# Patient Record
Sex: Female | Born: 1990 | Race: White | Hispanic: No | Marital: Married | Smoking: Never smoker
Health system: Southern US, Community
[De-identification: ages and names within clinical notes are randomized; demographics above are authoritative.]

---
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
## 2010-10-26 IMAGING — US US ABDOMEN COMPLETE
1 series · 13 of 25 positions shown · non-contrast
Comparison: [HOSPITAL] ultrasound 12/13/2008

CLINICAL DATA: Follow-up liver lesion.

COMPLETE ABDOMINAL ULTRASOUND

[Series 1: us abd limited · 0.32mm/px · 13 of 34 slices shown]
[im 1/34]
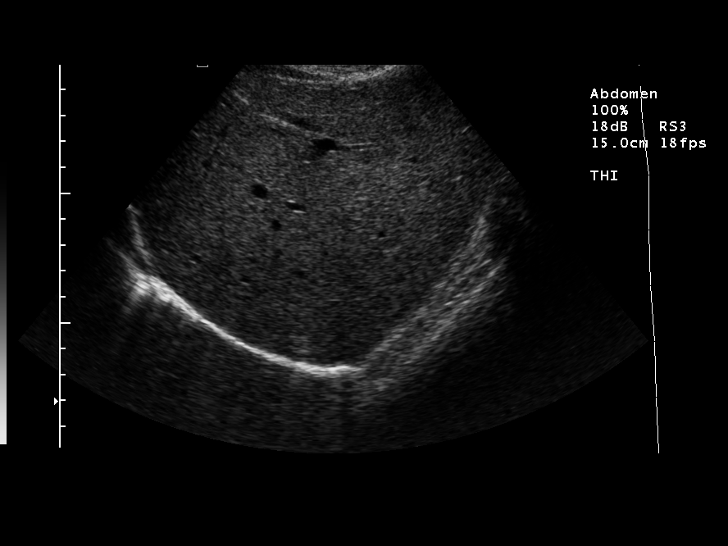
[im 3/34]
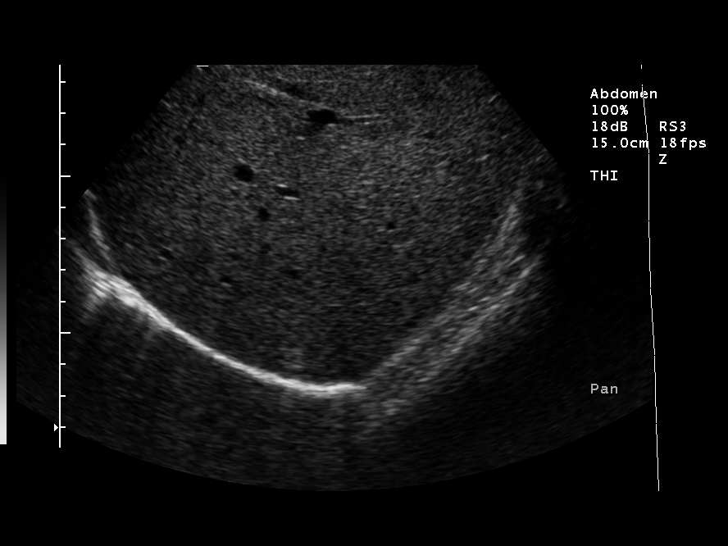
[im 6/34]
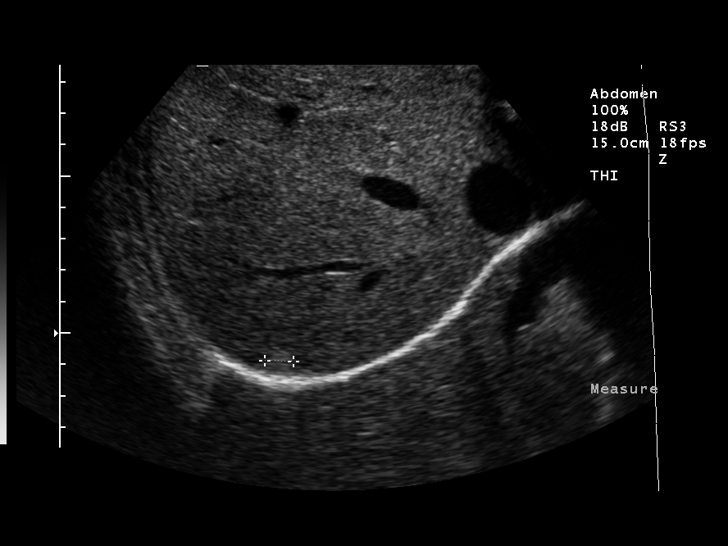
[im 9/34]
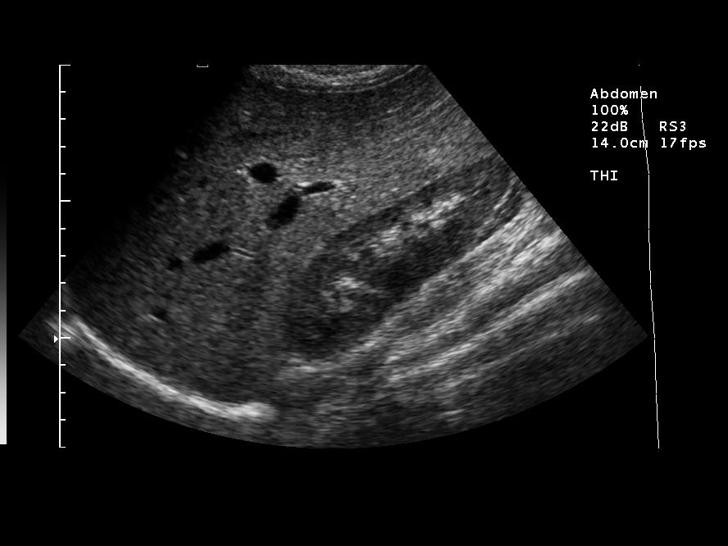
[im 12/34]
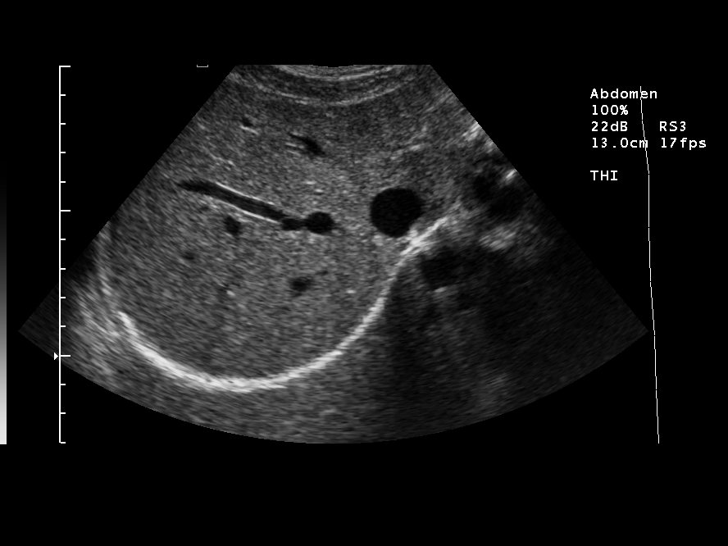
[im 14/34]
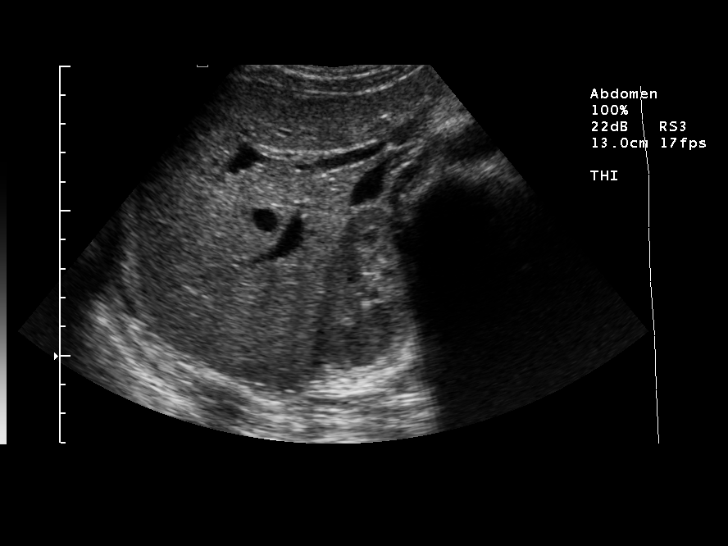
[im 17/34]
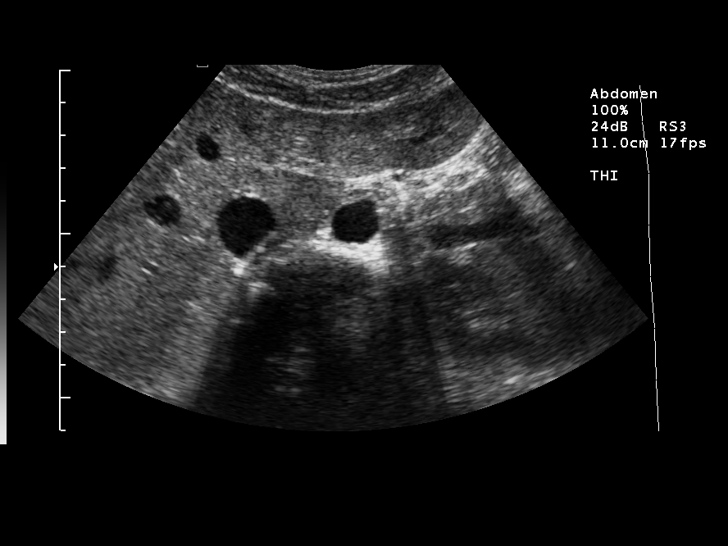
[im 20/34]
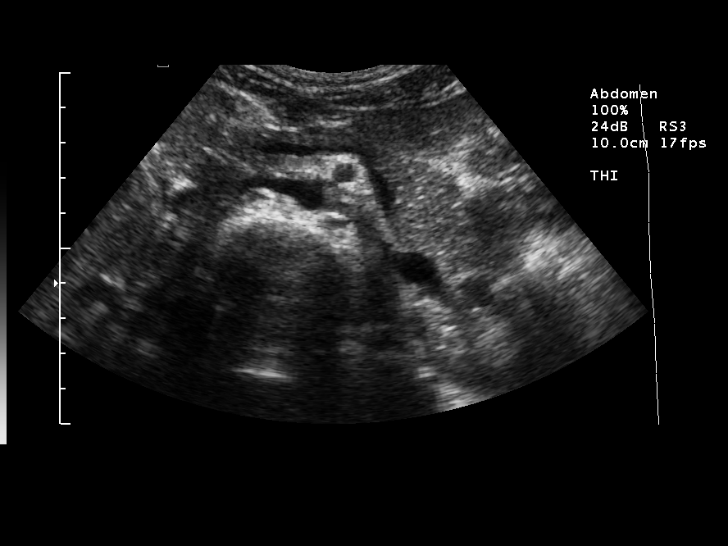
[im 23/34]
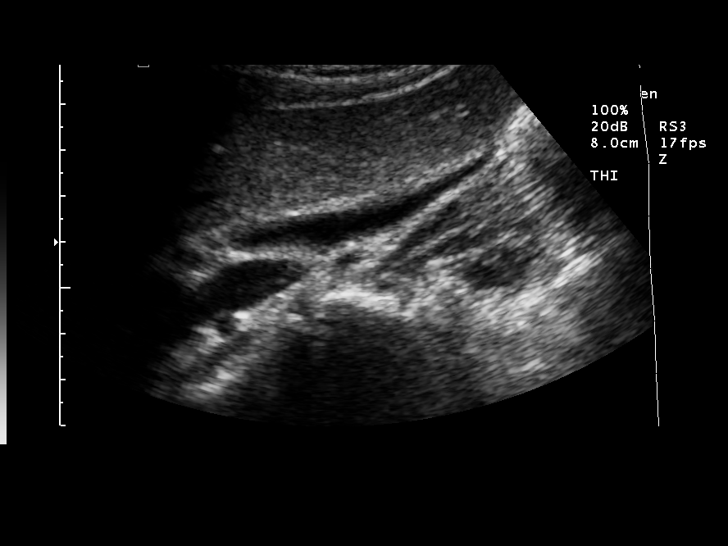
[im 25/34]
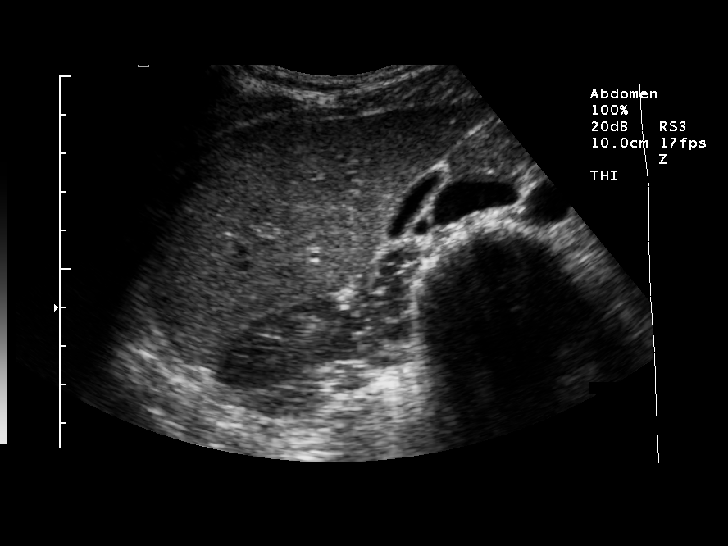
[im 28/34]
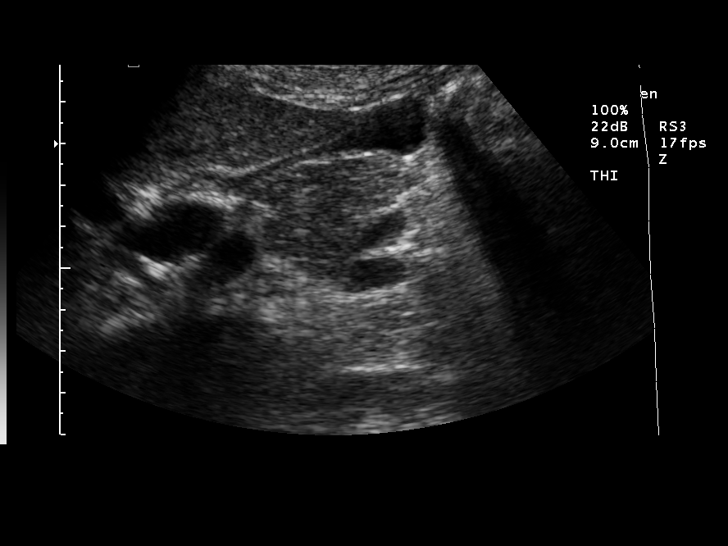
[im 31/34]
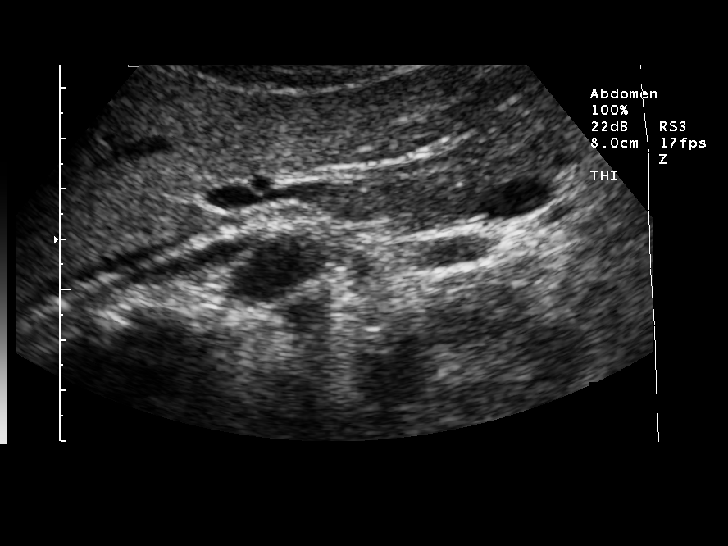
[im 34/34]
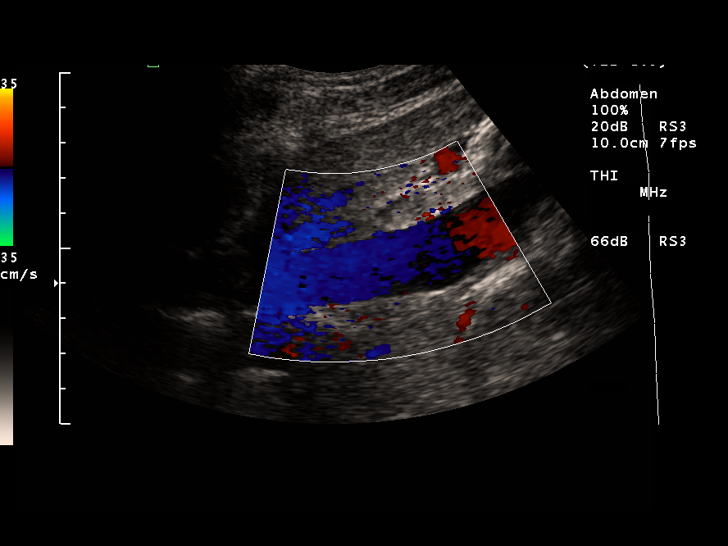

[13 of 25 positions shown; findings below may reference images not displayed]

FINDINGS: Gallbladder:  No stones or wall thickening.  Negative sonographic
Odd-Gunnar.

Common bile duct:   Within normal limits in caliber.

Liver:  Again noted is the posterior right hepatic lobe hyperechoic
lesion.  This currently measures 11 x 11 x 8 mm, compared to 9 x 9
x 8 mm previously.  Despite the fact that this measures slightly
larger on today's study, visually, this appears stable.  I favor
that the slight apparent increase in size is more likely related to
differences in scan planes and measuring technique.  No new liver
lesion is seen.

IVC:  Appears normal.

Pancreas:  No focal abnormality seen.

Spleen:  Within normal limits in size and echotexture.

Right Kidney:   Normal in size and parenchymal echogenicity.  No
evidence of mass or hydronephrosis.

Left Kidney:  Normal in size and parenchymal echogenicity.  No
evidence of mass or hydronephrosis.

Abdominal aorta:  No aneurysm identified.
IMPRESSION: 11 mm hyperechoic posterior right hepatic lesion remains most
consistent with hemangioma.  Visually, this appears stable and I
doubt significant change.

## 2011-05-20 LAB — PREGNANCY, URINE: Preg Test, Ur: NEGATIVE

## 2013-12-19 ENCOUNTER — Encounter (HOSPITAL_COMMUNITY): Payer: Self-pay | Admitting: Emergency Medicine

## 2013-12-19 ENCOUNTER — Emergency Department (HOSPITAL_COMMUNITY)
Admission: EM | Admit: 2013-12-19 | Discharge: 2013-12-19 | Disposition: A | Discharge status: Home / Self Care | Payer: 59 | Attending: Emergency Medicine | Admitting: Emergency Medicine

## 2013-12-19 ENCOUNTER — Emergency Department (HOSPITAL_COMMUNITY): Payer: 59

## 2013-12-19 DIAGNOSIS — Y92410 Unspecified street and highway as the place of occurrence of the external cause: Secondary | ICD-10-CM | POA: Insufficient documentation

## 2013-12-19 DIAGNOSIS — Y9389 Activity, other specified: Secondary | ICD-10-CM | POA: Insufficient documentation

## 2013-12-19 DIAGNOSIS — S43499A Other sprain of unspecified shoulder joint, initial encounter: Secondary | ICD-10-CM | POA: Insufficient documentation

## 2013-12-19 DIAGNOSIS — R209 Unspecified disturbances of skin sensation: Secondary | ICD-10-CM | POA: Insufficient documentation

## 2013-12-19 DIAGNOSIS — S46912A Strain of unspecified muscle, fascia and tendon at shoulder and upper arm level, left arm, initial encounter: Secondary | ICD-10-CM

## 2013-12-19 DIAGNOSIS — S199XXA Unspecified injury of neck, initial encounter: Secondary | ICD-10-CM

## 2013-12-19 DIAGNOSIS — S46819A Strain of other muscles, fascia and tendons at shoulder and upper arm level, unspecified arm, initial encounter: Secondary | ICD-10-CM

## 2013-12-19 DIAGNOSIS — S0993XA Unspecified injury of face, initial encounter: Secondary | ICD-10-CM | POA: Insufficient documentation

## 2013-12-19 MED ORDER — NAPROXEN 500 MG PO TABS: 500.0000 mg | ORAL_TABLET | Freq: Two times a day (BID) | ORAL | Status: AC

## 2013-12-19 MED ORDER — METHOCARBAMOL 500 MG PO TABS: 500.0000 mg | ORAL_TABLET | Freq: Two times a day (BID) | ORAL | Status: AC
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
## 2013-12-19 NOTE — Discharge Instructions (Signed)
Recommend Mobic and Robaxin as prescribed. Apply ice to your shoulder 3-4 times per day. Refrain from strenuous activity or heavy lifting for 1 to 2 weeks. Take your shoulder out of the sling 3-4 times per day for stretching. Followup with an orthopedist if symptoms persist.  Muscle Strain A muscle strain is an injury that occurs when a muscle is stretched beyond its normal length. Usually a small number of muscle fibers are torn when this happens. Muscle strain is rated in degrees. First-degree strains have the least amount of muscle fiber tearing and pain. Second-degree and third-degree strains have increasingly more tearing and pain.  Usually, recovery from muscle strain takes 1 2 weeks. Complete healing takes 5 6 weeks.  CAUSES  Muscle strain happens when a sudden, violent force placed on a muscle stretches it too far. This may occur with lifting, sports, or a fall.  RISK FACTORS Muscle strain is especially common in athletes.  SIGNS AND SYMPTOMS At the site of the muscle strain, there may be:  Pain.  Bruising.  Swelling.  Difficulty using the muscle due to pain or lack of normal function. DIAGNOSIS  Your health care provider will perform a physical exam and ask about your medical history. TREATMENT  Often, the best treatment for a muscle strain is resting, icing, and applying cold compresses to the injured area.  HOME CARE INSTRUCTIONS   Use the PRICE method of treatment to promote muscle healing during the first 2 3 days after your injury. The PRICE method involves:  Protecting the muscle from being injured again.  Restricting your activity and resting the injured body part.  Icing your injury. To do this, put ice in a plastic bag. Place a towel between your skin and the bag. Then, apply the ice and leave it on from 15 20 minutes each hour. After the third day, switch to moist heat packs.  Apply compression to the injured area with a splint or elastic bandage. Be careful not  to wrap it too tightly. This may interfere with blood circulation or increase swelling.  Elevate the injured body part above the level of your heart as often as you can.  Only take over-the-counter or prescription medicines for pain, discomfort, or fever as directed by your health care provider.  Warming up prior to exercise helps to prevent future muscle strains. SEEK MEDICAL CARE IF:   You have increasing pain or swelling in the injured area.  You have numbness, tingling, or a significant loss of strength in the injured area. MAKE SURE YOU:   Understand these instructions.  Will watch your condition.  Will get help right away if you are not doing well or get worse. Document Released: 08/16/2005 Document Revised: 06/06/2013 Document Reviewed: 03/15/2013 [facility] Patient Information 2014 [place], [place]. Shoulder Exercises EXERCISES  RANGE OF MOTION (ROM) AND STRETCHING EXERCISES These exercises may help you when beginning to rehabilitate your injury. Your symptoms may resolve with or without further involvement from your physician, physical therapist or athletic trainer. While completing these exercises, remember:   Restoring tissue flexibility helps normal motion to return to the joints. This allows healthier, less painful movement and activity.  An effective stretch should be held for at least 30 seconds.  A stretch should never be painful. You should only feel a gentle lengthening or release in the stretched tissue. ROM - Pendulum  Bend at the waist so that your right / left arm falls away from your body. Support yourself with your opposite hand  on a solid surface, such as a table or a countertop.  Your right / left arm should be perpendicular to the ground. If it is not perpendicular, you need to lean over farther. Relax the muscles in your right / left arm and shoulder as much as possible.  Gently sway your hips and trunk so they move your right / left arm without any use  of your right / left shoulder muscles.  Progress your movements so that your right / left arm moves side to side, then forward and backward, and finally, both clockwise and counterclockwise.  Complete __________ repetitions in each direction. Many people use this exercise to relieve discomfort in their shoulder as well as to gain range of motion. Repeat __________ times. Complete this exercise __________ times per day. STRETCH  Flexion, Standing  Stand with good posture. With an underhand grip on your right / left hand and an overhand grip on the opposite hand, grasp a broomstick or cane so that your hands are a little more than shoulder-width apart.  Keeping your right / left elbow straight and shoulder muscles relaxed, push the stick with your opposite hand to raise your right / left arm in front of your body and then overhead. Raise your arm until you feel a stretch in your right / left shoulder, but before you have increased shoulder pain.  Try to avoid shrugging your right / left shoulder as your arm rises by keeping your shoulder blade tucked down and toward your mid-back spine. Hold __________ seconds.  Slowly return to the starting position. Repeat __________ times. Complete this exercise __________ times per day. STRETCH - Internal Rotation  Place your right / left hand behind your back, palm-up.  Throw a towel or belt over your opposite shoulder. Grasp the towel/belt with your right / left hand.  While keeping an upright posture, gently pull up on the towel/belt until you feel a stretch in the front of your right / left shoulder.  Avoid shrugging your right / left shoulder as your arm rises by keeping your shoulder blade tucked down and toward your mid-back spine.  Hold __________. Release the stretch by lowering your opposite hand. Repeat __________ times. Complete this exercise __________ times per day. STRETCH - External Rotation and Abduction  Stagger your stance through a  doorframe. It does not matter which foot is forward.  As instructed by your physician, physical therapist or athletic trainer, place your hands:  And forearms above your head and on the door frame.  And forearms at head-height and on the door frame.  At elbow-height and on the door frame.  Keeping your head and chest upright and your stomach muscles tight to prevent over-extending your low-back, slowly shift your weight onto your front foot until you feel a stretch across your chest and/or in the front of your shoulders.  Hold __________ seconds. Shift your weight to your back foot to release the stretch. Repeat __________ times. Complete this stretch __________ times per day.  STRENGTHENING EXERCISES  These exercises may help you when beginning to rehabilitate your injury. They may resolve your symptoms with or without further involvement from your physician, physical therapist or athletic trainer. While completing these exercises, remember:   Muscles can gain both the endurance and the strength needed for everyday activities through controlled exercises.  Complete these exercises as instructed by your physician, physical therapist or athletic trainer. Progress the resistance and repetitions only as guided.  You may experience muscle soreness or  fatigue, but the pain or discomfort you are trying to eliminate should never worsen during these exercises. If this pain does worsen, stop and make certain you are following the directions exactly. If the pain is still present after adjustments, discontinue the exercise until you can discuss the trouble with your clinician.  If advised by your physician, during your recovery, avoid activity or exercises which involve actions that place your right / left hand or elbow above your head or behind your back or head. These positions stress the tissues which are trying to heal. STRENGTH - Scapular Depression and Adduction  With good posture, sit on a firm  chair. Supported your arms in front of you with pillows, arm rests or a table top. Have your elbows in line with the sides of your body.  Gently draw your shoulder blades down and toward your mid-back spine. Gradually increase the tension without tensing the muscles along the top of your shoulders and the back of your neck.  Hold for __________ seconds. Slowly release the tension and relax your muscles completely before completing the next repetition.  After you have practiced this exercise, remove the arm support and complete it in standing as well as sitting. Repeat __________ times. Complete this exercise __________ times per day.  STRENGTH - External Rotators  Secure a rubber exercise band/tubing to a fixed object so that it is at the same height as your right / left elbow when you are standing or sitting on a firm surface.  Stand or sit so that the secured exercise band/tubing is at your side that is not injured.  Bend your elbow 90 degrees. Place a folded towel or small pillow under your right / left arm so that your elbow is a few inches away from your side.  Keeping the tension on the exercise band/tubing, pull it away from your body, as if pivoting on your elbow. Be sure to keep your body steady so that the movement is only coming from your shoulder rotating.  Hold __________ seconds. Release the tension in a controlled manner as you return to the starting position. Repeat __________ times. Complete this exercise __________ times per day.  STRENGTH - Supraspinatus  Stand or sit with good posture. Grasp a __________ weight or an exercise band/tubing so that your hand is "thumbs-up," like when you shake hands.  Slowly lift your right / left hand from your thigh into the air, traveling about 30 degrees from straight out at your side. Lift your hand to shoulder height or as far as you can without increasing any shoulder pain. Initially, many people do not lift their hands above shoulder  height.  Avoid shrugging your right / left shoulder as your arm rises by keeping your shoulder blade tucked down and toward your mid-back spine.  Hold for __________ seconds. Control the descent of your hand as you slowly return to your starting position. Repeat __________ times. Complete this exercise __________ times per day.  STRENGTH - Shoulder Extensors  Secure a rubber exercise band/tubing so that it is at the height of your shoulders when you are either standing or sitting on a firm arm-less chair.  With a thumbs-up grip, grasp an end of the band/tubing in each hand. Straighten your elbows and lift your hands straight in front of you at shoulder height. Step back away from the secured end of band/tubing until it becomes tense.  Squeezing your shoulder blades together, pull your hands down to the sides of your thighs.  Do not allow your hands to go behind you.  Hold for __________ seconds. Slowly ease the tension on the band/tubing as you reverse the directions and return to the starting position. Repeat __________ times. Complete this exercise __________ times per day.  STRENGTH - Scapular Retractors  Secure a rubber exercise band/tubing so that it is at the height of your shoulders when you are either standing or sitting on a firm arm-less chair.  With a palm-down grip, grasp an end of the band/tubing in each hand. Straighten your elbows and lift your hands straight in front of you at shoulder height. Step back away from the secured end of band/tubing until it becomes tense.  Squeezing your shoulder blades together, draw your elbows back as you bend them. Keep your upper arm lifted away from your body throughout the exercise.  Hold __________ seconds. Slowly ease the tension on the band/tubing as you reverse the directions and return to the starting position. Repeat __________ times. Complete this exercise __________ times per day. STRENGTH  Scapular Depressors  Find a sturdy chair  without wheels, such as a from a dining room table.  Keeping your feet on the floor, lift your bottom from the seat and lock your elbows.  Keeping your elbows straight, allow gravity to pull your body weight down. Your shoulders will rise toward your ears.  Raise your body against gravity by drawing your shoulder blades down your back, shortening the distance between your shoulders and ears. Although your feet should always maintain contact with the floor, your feet should progressively support less body weight as you get stronger.  Hold __________ seconds. In a controlled and slow manner, lower your body weight to begin the next repetition. Repeat __________ times. Complete this exercise __________ times per day.  Document Released: 06/30/2005 Document Revised: 11/08/2011 Document Reviewed: 11/28/2008 [facility]cExitCare Patient Information 2014 [place] Marcel-StillwaterExitCare, MarylandLLC.

## 2013-12-21 NOTE — ED Provider Notes (Signed)
Medical screening examination/treatment/procedure(s) were performed by non-physician practitioner and as supervising physician I was immediately available for consultation/collaboration.   EKG Interpretation None       Raeford RazorStephen Annell Canty, MD 12/21/13 0730
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
